# Patient Record
Sex: Female | Born: 2000 | Race: Black or African American | Hispanic: No | Marital: Single | Smoking: Never smoker
Health system: Southern US, Community
[De-identification: ages and names within clinical notes are randomized; demographics above are authoritative.]

---
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
## 2022-04-20 ENCOUNTER — Ambulatory Visit
Admission: EM | Admit: 2022-04-20 | Discharge: 2022-04-20 | Disposition: A | Discharge status: Home / Self Care | Payer: Medicaid Other

## 2022-04-20 ENCOUNTER — Encounter: Payer: Self-pay | Admitting: Emergency Medicine

## 2022-04-20 ENCOUNTER — Other Ambulatory Visit: Payer: Self-pay

## 2022-04-20 DIAGNOSIS — R197 Diarrhea, unspecified: Secondary | ICD-10-CM

## 2022-04-20 DIAGNOSIS — B349 Viral infection, unspecified: Secondary | ICD-10-CM

## 2022-04-20 NOTE — Discharge Instructions (Signed)
It appears that your symptoms are viral in nature.  This should run its course and self resolve.  Recommend that you increase clear oral fluid intake to prevent dehydration.  Also recommend a bland diet.  See instructions attached.  Follow-up if symptoms persist or worsen.

## 2022-04-20 NOTE — ED Triage Notes (Signed)
Pt here for diarrhea x 30 hours; denies vomiting

## 2022-04-20 NOTE — ED Provider Notes (Signed)
[facility]    CSN: [id number] Arrival date & time: 04/20/22  1620      History   Chief Complaint Chief Complaint  Patient presents with   Diarrhea    HPI Ruth Cooke is a 21 y.o. female.   Patient presents with diarrhea that has been present for about 24 to 30 hours.  Patient reports that she has an episode of diarrhea every hour.  Denies nausea, vomiting, abdominal pain, blood in stool.  Patient reports that her family members have similar symptoms currently.  She has been able to drink fluids and keep them down.  Denies any associated fever. Denies dizziness or fatigue.    Diarrhea   History reviewed. No pertinent past medical history.  There are no problems to display for this patient.   History reviewed. No pertinent surgical history.  OB History   No obstetric history on file.      Home Medications    Prior to Admission medications   Not on File    Family History History reviewed. No pertinent family history.  Social History     Allergies   Patient has no known allergies.   Review of Systems Review of Systems Per HPI  Physical Exam Triage Vital Signs ED Triage Vitals [04/20/22 1638]  Enc Vitals Group     BP 129/85     Pulse Rate 90     Resp 18     Temp 98.1 F (36.7 C)     Temp Source Oral     SpO2 98 %     Weight      Height      Head Circumference      Peak Flow      Pain Score 0     Pain Loc      Pain Edu?      Excl. in GC?    No data found.  Updated Vital Signs BP 129/85 (BP Location: Left Arm)   Pulse 90   Temp 98.1 F (36.7 C) (Oral)   Resp 18   SpO2 98%   Visual Acuity Right Eye Distance:   Left Eye Distance:   Bilateral Distance:    Right Eye Near:   Left Eye Near:    Bilateral Near:     Physical Exam Constitutional:      General: She is not in acute distress.    Appearance: Normal appearance. She is not toxic-appearing or diaphoretic.  HENT:     Head: Normocephalic and atraumatic.      Mouth/Throat:     Mouth: Mucous membranes are moist.     Pharynx: No posterior oropharyngeal erythema.  Eyes:     Extraocular Movements: Extraocular movements intact.     Conjunctiva/sclera: Conjunctivae normal.  Cardiovascular:     Rate and Rhythm: Normal rate and regular rhythm.     Pulses: Normal pulses.     Heart sounds: Normal heart sounds.  Pulmonary:     Effort: Pulmonary effort is normal. No respiratory distress.     Breath sounds: Normal breath sounds.  Abdominal:     General: Bowel sounds are normal. There is no distension.     Palpations: Abdomen is soft.     Tenderness: There is no abdominal tenderness.  Neurological:     General: No focal deficit present.     Mental Status: She is alert and oriented to person, place, and time. Mental status is at baseline.  Psychiatric:        Mood  and Affect: Mood normal.        Behavior: Behavior normal.        Thought Content: Thought content normal.        Judgment: Judgment normal.      UC Treatments / Results  Labs (all labs ordered are listed, but only abnormal results are displayed) Labs Reviewed - No data to display  EKG   Radiology No results found.  Procedures Procedures (including critical care time)  Medications Ordered in UC Medications - No data to display  Initial Impression / Assessment and Plan / UC Course  I have reviewed the triage vital signs and the nursing notes.  Pertinent labs & imaging results that were available during my care of the patient were reviewed by me and considered in my medical decision making (see chart for details).     Suspect viral cause to patient's symptoms given associated known sick contacts.  Advised bland diet and increasing clear oral fluid intake to prevent dehydration.  No current signs of dehydration on exam as patient appears stable and vital signs are normal.  Patient was given strict return and ER precautions.  Patient verbalized understanding and was agreeable  with plan. Final Clinical Impressions(s) / UC Diagnoses   Final diagnoses:  Diarrhea, unspecified type  Viral illness     Discharge Instructions      It appears that your symptoms are viral in nature.  This should run its course and self resolve.  Recommend that you increase clear oral fluid intake to prevent dehydration.  Also recommend a bland diet.  See instructions attached.  Follow-up if symptoms persist or worsen.    ED Prescriptions   None    PDMP not reviewed this encounter.   Gustavus Bryant, [place] 04/20/22 [phone]

## 2022-06-05 ENCOUNTER — Ambulatory Visit: Payer: Medicaid Other

## 2022-06-07 ENCOUNTER — Ambulatory Visit
Admission: EM | Admit: 2022-06-07 | Discharge: 2022-06-07 | Disposition: A | Discharge status: Home / Self Care | Payer: Medicaid Other

## 2022-06-07 DIAGNOSIS — K529 Noninfective gastroenteritis and colitis, unspecified: Secondary | ICD-10-CM

## 2022-06-07 DIAGNOSIS — E669 Obesity, unspecified: Secondary | ICD-10-CM | POA: Insufficient documentation

## 2022-06-07 MED ORDER — LOPERAMIDE HCL 2 MG PO CAPS: 2.0000 mg | ORAL_CAPSULE | Freq: Two times a day (BID) | ORAL | 0 refills | Status: DC | PRN

## 2022-06-07 MED ORDER — ONDANSETRON 8 MG PO TBDP: 8.0000 mg | ORAL_TABLET | Freq: Three times a day (TID) | ORAL | 0 refills | Status: DC | PRN

## 2022-06-07 NOTE — Discharge Instructions (Addendum)

## 2022-06-07 NOTE — ED Provider Notes (Signed)
  [facility]  Note:  This document was prepared using Systems analyst and may include unintentional dictation errors.  MRN: [id number] DOB: February 01, 2001  Subjective:   Ruth Cooke is a 21 y.o. female presenting for 4-5 day history of acute onset diarrhea, watery loose stools, belly cramping and discomfort. Symptoms started after eating at [employer]. Normally does not eat out there at all. No fever, nausea or vomiting, bloody stools, recent antibiotic use, hospitalizations or long distance travel.  Has not eaten raw foods, drank unfiltered water.  No history of GI disorders including Crohn's, IBS, ulcerative colitis.   No current facility-administered medications for this encounter.  Current Outpatient Medications:    etonogestrel (NEXPLANON) 68 MG IMPL implant, PROVIDED BY [facility], Disp: , Rfl:    No Known Allergies  History reviewed. No pertinent past medical history.   History reviewed. No pertinent surgical history.  History reviewed. No pertinent family history.  Social History   Tobacco Use   Smoking status: Never   Smokeless tobacco: Never  Vaping Use   Vaping Use: Never used  Substance Use Topics   Alcohol use: Not Currently   Drug use: Yes    Types: Marijuana    ROS   Objective:   Vitals: BP 122/77 (BP Location: Right Arm)   Pulse 88   Temp 98.1 F (36.7 C) (Oral)   Resp 16   SpO2 98%   Physical Exam Constitutional:      General: She is not in acute distress.    Appearance: Normal appearance. She is well-developed. She is not ill-appearing, toxic-appearing or diaphoretic.  HENT:     Head: Normocephalic and atraumatic.     Nose: Nose normal.     Mouth/Throat:     Mouth: Mucous membranes are moist.  Eyes:     General: No scleral icterus.       Right eye: No discharge.        Left eye: No discharge.     Extraocular Movements: Extraocular movements intact.     Conjunctiva/sclera: Conjunctivae normal.   Cardiovascular:     Rate and Rhythm: Normal rate.  Pulmonary:     Effort: Pulmonary effort is normal.  Abdominal:     General: Bowel sounds are increased. There is no distension.     Palpations: Abdomen is soft. There is no mass.     Tenderness: There is no abdominal tenderness. There is no right CVA tenderness, left CVA tenderness, guarding or rebound.  Skin:    General: Skin is warm and dry.  Neurological:     General: No focal deficit present.     Mental Status: She is alert and oriented to person, place, and time.  Psychiatric:        Mood and Affect: Mood normal.        Behavior: Behavior normal.        Thought Content: Thought content normal.        Judgment: Judgment normal.     Assessment and Plan :   PDMP not reviewed this encounter.  1. Colitis     Will manage for suspected colitis with supportive care.  Recommended patient hydrate well, eat light meals and maintain electrolytes.  Will use Zofran and Imodium for nausea, vomiting and diarrhea. Counseled patient on potential for adverse effects with medications prescribed/recommended today, ER and return-to-clinic precautions discussed, patient verbalized understanding.    Jaynee Eagles, [place] 06/08/22 [phone]

## 2022-06-07 NOTE — ED Triage Notes (Signed)
Patient presents to [facility] for GI problem. Pt states runny stools last 4-5 days. Not taking any medications for symptom relief.   Denies fever or n/v.

## 2022-08-04 ENCOUNTER — Ambulatory Visit
Admission: EM | Admit: 2022-08-04 | Discharge: 2022-08-04 | Disposition: A | Discharge status: Home / Self Care | Payer: Medicaid Other | Attending: Emergency Medicine | Admitting: Emergency Medicine

## 2022-08-04 DIAGNOSIS — K529 Noninfective gastroenteritis and colitis, unspecified: Secondary | ICD-10-CM

## 2022-08-04 MED ORDER — LOPERAMIDE HCL 2 MG PO TABS: 2.0000 mg | ORAL_TABLET | Freq: Four times a day (QID) | ORAL | 0 refills | Status: AC | PRN

## 2022-08-04 MED ORDER — ONDANSETRON 4 MG PO TBDP: 4.0000 mg | ORAL_TABLET | Freq: Once | ORAL | Status: DC

## 2022-08-04 MED ORDER — ONDANSETRON 4 MG PO TBDP: 4.0000 mg | ORAL_TABLET | Freq: Three times a day (TID) | ORAL | 0 refills | Status: AC | PRN

## 2022-08-04 NOTE — Discharge Instructions (Addendum)

## 2022-08-04 NOTE — ED Provider Notes (Signed)
[facility]    CSN: [id number] Arrival date & time: 08/04/22  1932    HISTORY   Chief Complaint  Patient presents with   Vomiting   Diarrhea   HPI Ruth Cooke is a pleasant, 21 y.o. female who presents to urgent care today. Patient complains of diarrhea and abdominal cramping that began around 5 AM this morning.  Patient states she has also had a few episodes of vomiting.  Patient states she has not tried the Imodium that was prescribed for her a month ago for similar symptoms.  Patient states she is also needing a note for work.  The history is provided by the patient.   History reviewed. No pertinent past medical history. Patient Active Problem List   Diagnosis Date Noted   Obesity 06/07/2022   Nocturnal enuresis 09/26/2015   History reviewed. No pertinent surgical history. OB History   No obstetric history on file.    Home Medications    Prior to Admission medications   Medication Sig Start Date End Date Taking? Authorizing Provider  loperamide (IMODIUM A-D) 2 MG tablet Take 1 tablet (2 mg total) by mouth 4 (four) times daily as needed for diarrhea or loose stools. 08/04/22  Yes Theadora Rama Scales, PA-C  ondansetron (ZOFRAN-ODT) 4 MG disintegrating tablet Take 1 tablet (4 mg total) by mouth every 8 (eight) hours as needed for nausea or vomiting. 08/04/22  Yes Theadora Rama Scales, PA-C  etonogestrel (NEXPLANON) 68 MG IMPL implant PROVIDED BY [facility] 09/22/21   [provider]    Family History History reviewed. No pertinent family history. Social History Social History   Tobacco Use   Smoking status: Never   Smokeless tobacco: Never  Vaping Use   Vaping Use: Never used  Substance Use Topics   Alcohol use: Not Currently   Drug use: Yes    Types: Marijuana   Allergies   Patient has no known allergies.  Review of Systems Review of Systems Pertinent findings revealed after performing a 14 point review of systems has been noted  in the history of present illness.  Physical Exam Triage Vital Signs ED Triage Vitals  Enc Vitals Group     BP 06/12/21 0827 (!) 147/82     Pulse Rate 06/12/21 0827 72     Resp 06/12/21 0827 18     Temp 06/12/21 0827 98.3 F (36.8 C)     Temp Source 06/12/21 0827 Oral     SpO2 06/12/21 0827 98 %     Weight --      Height --      Head Circumference --      Peak Flow --      Pain Score 06/12/21 0826 5     Pain Loc --      Pain Edu? --      Excl. in GC? --   No data found.  Updated Vital Signs BP 122/81 (BP Location: Left Arm)   Pulse 88   Temp 98.1 F (36.7 C) (Oral)   Resp 16   LMP 08/01/2022   SpO2 98%   Physical Exam Vitals and nursing note reviewed.  Constitutional:      General: She is awake. She is not in acute distress.    Appearance: Normal appearance.  HENT:     Head: Normocephalic and atraumatic.  Eyes:     Pupils: Pupils are equal, round, and reactive to light.  Cardiovascular:     Rate and Rhythm: Normal rate and regular  rhythm.  Pulmonary:     Effort: Pulmonary effort is normal.     Breath sounds: Normal breath sounds.  Abdominal:     General: Abdomen is flat. Bowel sounds are normal. There is no distension.     Palpations: Abdomen is soft. There is no mass.     Tenderness: There is no abdominal tenderness. There is no guarding or rebound.     Hernia: No hernia is present.  Musculoskeletal:        General: Normal range of motion.     Cervical back: Normal range of motion and neck supple.  Skin:    General: Skin is warm and dry.  Neurological:     General: No focal deficit present.     Mental Status: She is alert and oriented to person, place, and time. Mental status is at baseline.  Psychiatric:        Mood and Affect: Mood normal.        Behavior: Behavior normal. Behavior is cooperative.        Thought Content: Thought content normal.        Judgment: Judgment normal.     Visual Acuity Right Eye Distance:   Left Eye Distance:    Bilateral Distance:    Right Eye Near:   Left Eye Near:    Bilateral Near:     UC Couse / Diagnostics / Procedures:     Radiology No results found.  Procedures Procedures (including critical care time) EKG  Pending results:  Labs Reviewed - No data to display  Medications Ordered in UC: Medications - No data to display   UC Diagnoses / Final Clinical Impressions(s)   I have reviewed the triage vital signs and the nursing notes.  Pertinent labs & imaging results that were available during my care of the patient were reviewed by me and considered in my medical decision making (see chart for details).    Final diagnoses:  Gastroenteritis   Patient is well-appearing during her visit today.  Physical exam is normal.  Patient provided with a note to return to work.  Patient was provided with renewals of prescriptions for Imodium and Zofran for symptoms.  Advised.  Please see discharge instructions below for details of plan of care as provided to patient. ED Prescriptions     Medication Sig Dispense Auth. Provider   loperamide (IMODIUM A-D) 2 MG tablet Take 1 tablet (2 mg total) by mouth 4 (four) times daily as needed for diarrhea or loose stools. 30 tablet Theadora Rama Scales, PA-C   ondansetron (ZOFRAN-ODT) 4 MG disintegrating tablet Take 1 tablet (4 mg total) by mouth every 8 (eight) hours as needed for nausea or vomiting. 20 tablet Theadora Rama Scales, PA-C      PDMP not reviewed this encounter.  Pending results:  Labs Reviewed - No data to display  Discharge Instructions:   Discharge Instructions      Make sure you push fluids drinking mostly water but mix it with Gatorade.  Try to eat light meals including soups, broths and soft foods, fruits.  You may use Zofran for your nausea and vomiting once every 8 hours.  Imodium can help with diarrhea but use this carefully limiting it to 1-2 times per day only if you are having a lot of diarrhea.  Please return to  the clinic if symptoms worsen or you start having severe abdominal pain not helped by taking Tylenol or start having bloody stools or blood in the vomit.  Disposition Upon Discharge:  Condition: stable for discharge home  Patient presented with an acute illness with associated systemic symptoms and significant discomfort requiring urgent management. In my opinion, this is a condition that a prudent lay person (someone who possesses an average knowledge of health and medicine) may potentially expect to result in complications if not addressed urgently such as respiratory distress, impairment of bodily function or dysfunction of bodily organs.   Routine symptom specific, illness specific and/or disease specific instructions were discussed with the patient and/or caregiver at length.   As such, the patient has been evaluated and assessed, work-up was performed and treatment was provided in alignment with urgent care protocols and evidence based medicine.  Patient/parent/caregiver has been advised that the patient may require follow up for further testing and treatment if the symptoms continue in spite of treatment, as clinically indicated and appropriate.  Patient/parent/caregiver has been advised to return to the [facility] or PCP if no better; to PCP or the Emergency Department if new signs and symptoms develop, or if the current signs or symptoms continue to change or worsen for further workup, evaluation and treatment as clinically indicated and appropriate  The patient will follow up with their current PCP if and as advised. If the patient does not currently have a PCP we will assist them in obtaining one.   The patient may need specialty follow up if the symptoms continue, in spite of conservative treatment and management, for further workup, evaluation, consultation and treatment as clinically indicated and appropriate.  Patient/parent/caregiver verbalized understanding and agreement of plan as  discussed.  All questions were addressed during visit.  Please see discharge instructions below for further details of plan.  This office note has been dictated using Teaching laboratory technician.  Unfortunately, this method of dictation can sometimes lead to typographical or grammatical errors.  I apologize for your inconvenience in advance if this occurs.  Please do not hesitate to reach out to me if clarification is needed.      Theadora Rama Scales, PA-C 08/04/22 2050

## 2022-08-04 NOTE — ED Triage Notes (Signed)
Pt reports having diarrhea, abd discomfort and has been vomiting.   Started: yesterday  Home interventions:  immodium
